# Patient Record
Sex: Female | Born: 1970 | Race: Black or African American | Hispanic: No | Smoking: Former smoker
Health system: Southern US, Community
[De-identification: ages and names within clinical notes are randomized; demographics above are authoritative.]

## PROBLEM LIST (undated history)

## (undated) DIAGNOSIS — F603 Borderline personality disorder: Secondary | ICD-10-CM

## (undated) DIAGNOSIS — I2699 Other pulmonary embolism without acute cor pulmonale: Secondary | ICD-10-CM

## (undated) DIAGNOSIS — F319 Bipolar disorder, unspecified: Secondary | ICD-10-CM

## (undated) DIAGNOSIS — J45909 Unspecified asthma, uncomplicated: Secondary | ICD-10-CM

## (undated) DIAGNOSIS — I82409 Acute embolism and thrombosis of unspecified deep veins of unspecified lower extremity: Secondary | ICD-10-CM

## (undated) DIAGNOSIS — E119 Type 2 diabetes mellitus without complications: Secondary | ICD-10-CM

## (undated) DIAGNOSIS — E7800 Pure hypercholesterolemia, unspecified: Secondary | ICD-10-CM

## (undated) DIAGNOSIS — D649 Anemia, unspecified: Secondary | ICD-10-CM

## (undated) DIAGNOSIS — F4310 Post-traumatic stress disorder, unspecified: Secondary | ICD-10-CM

## (undated) DIAGNOSIS — D689 Coagulation defect, unspecified: Secondary | ICD-10-CM

## (undated) DIAGNOSIS — I10 Essential (primary) hypertension: Secondary | ICD-10-CM

## (undated) DIAGNOSIS — M1990 Unspecified osteoarthritis, unspecified site: Secondary | ICD-10-CM

## (undated) DIAGNOSIS — E669 Obesity, unspecified: Secondary | ICD-10-CM

---

## 2015-11-01 ENCOUNTER — Emergency Department (HOSPITAL_COMMUNITY)
Admission: EM | Admit: 2015-11-01 | Discharge: 2015-11-01 | Disposition: A | Discharge status: Home / Self Care | Payer: Medicaid Other | Attending: Emergency Medicine | Admitting: Emergency Medicine

## 2015-11-01 ENCOUNTER — Encounter (HOSPITAL_COMMUNITY): Payer: Self-pay | Admitting: *Deleted

## 2015-11-01 DIAGNOSIS — Z87891 Personal history of nicotine dependence: Secondary | ICD-10-CM | POA: Diagnosis not present

## 2015-11-01 DIAGNOSIS — E119 Type 2 diabetes mellitus without complications: Secondary | ICD-10-CM | POA: Diagnosis not present

## 2015-11-01 DIAGNOSIS — E669 Obesity, unspecified: Secondary | ICD-10-CM | POA: Insufficient documentation

## 2015-11-01 DIAGNOSIS — L0231 Cutaneous abscess of buttock: Secondary | ICD-10-CM | POA: Insufficient documentation

## 2015-11-01 DIAGNOSIS — R51 Headache: Secondary | ICD-10-CM | POA: Insufficient documentation

## 2015-11-01 DIAGNOSIS — I10 Essential (primary) hypertension: Secondary | ICD-10-CM | POA: Insufficient documentation

## 2015-11-01 HISTORY — DX: Pure hypercholesterolemia, unspecified: E78.00

## 2015-11-01 HISTORY — DX: Obesity, unspecified: E66.9

## 2015-11-01 HISTORY — DX: Type 2 diabetes mellitus without complications: E11.9

## 2015-11-01 HISTORY — DX: Other pulmonary embolism without acute cor pulmonale: I26.99

## 2015-11-01 HISTORY — DX: Acute embolism and thrombosis of unspecified deep veins of unspecified lower extremity: I82.409

## 2015-11-01 HISTORY — DX: Coagulation defect, unspecified: D68.9

## 2015-11-01 HISTORY — DX: Essential (primary) hypertension: I10

## 2015-11-01 HISTORY — DX: Borderline personality disorder: F60.3

## 2015-11-01 HISTORY — DX: Anemia, unspecified: D64.9

## 2015-11-01 HISTORY — DX: Post-traumatic stress disorder, unspecified: F43.10

## 2015-11-01 HISTORY — DX: Bipolar disorder, unspecified: F31.9

## 2015-11-01 NOTE — ED Notes (Signed)
Pt left from waiting room. Will not be back. Nurse was notified.

## 2015-11-01 NOTE — ED Notes (Signed)
Pt reports small abscess or bump on left buttock for extended amount of time but since last night it has increased in size and severe pain. Has headache today but denies n/v or fever.

## 2015-11-04 ENCOUNTER — Emergency Department (HOSPITAL_COMMUNITY)
Admission: EM | Admit: 2015-11-04 | Discharge: 2015-11-04 | Disposition: A | Discharge status: Home / Self Care | Payer: Medicaid Other | Attending: Emergency Medicine | Admitting: Emergency Medicine

## 2015-11-04 ENCOUNTER — Encounter (HOSPITAL_COMMUNITY): Payer: Self-pay

## 2015-11-04 DIAGNOSIS — F319 Bipolar disorder, unspecified: Secondary | ICD-10-CM | POA: Diagnosis not present

## 2015-11-04 DIAGNOSIS — L0231 Cutaneous abscess of buttock: Secondary | ICD-10-CM | POA: Diagnosis present

## 2015-11-04 DIAGNOSIS — E669 Obesity, unspecified: Secondary | ICD-10-CM | POA: Diagnosis not present

## 2015-11-04 DIAGNOSIS — Z86718 Personal history of other venous thrombosis and embolism: Secondary | ICD-10-CM | POA: Diagnosis not present

## 2015-11-04 DIAGNOSIS — L0291 Cutaneous abscess, unspecified: Secondary | ICD-10-CM

## 2015-11-04 DIAGNOSIS — Z87891 Personal history of nicotine dependence: Secondary | ICD-10-CM | POA: Insufficient documentation

## 2015-11-04 DIAGNOSIS — E119 Type 2 diabetes mellitus without complications: Secondary | ICD-10-CM | POA: Diagnosis not present

## 2015-11-04 DIAGNOSIS — Z79899 Other long term (current) drug therapy: Secondary | ICD-10-CM | POA: Insufficient documentation

## 2015-11-04 DIAGNOSIS — I10 Essential (primary) hypertension: Secondary | ICD-10-CM | POA: Diagnosis not present

## 2015-11-04 DIAGNOSIS — Z7901 Long term (current) use of anticoagulants: Secondary | ICD-10-CM | POA: Insufficient documentation

## 2015-11-04 DIAGNOSIS — D649 Anemia, unspecified: Secondary | ICD-10-CM | POA: Insufficient documentation

## 2015-11-04 DIAGNOSIS — Z7984 Long term (current) use of oral hypoglycemic drugs: Secondary | ICD-10-CM | POA: Insufficient documentation

## 2015-11-04 DIAGNOSIS — Z86711 Personal history of pulmonary embolism: Secondary | ICD-10-CM | POA: Insufficient documentation

## 2015-11-04 LAB — CBG MONITORING, ED: Glucose-Capillary: 111 mg/dL — ABNORMAL HIGH (ref 65–99)

## 2015-11-04 MED ORDER — CEPHALEXIN 500 MG PO CAPS: 500.0000 mg | ORAL_CAPSULE | Freq: Four times a day (QID) | ORAL | Status: DC

## 2015-11-04 NOTE — ED Notes (Signed)
Pt. Coming from  Home c/o abscess on left buttocks/upper thigh area. Pt. sts an area has been there over a month, but recently 'blew up' and starting leaking fluid. Pt. sts area is very painful.

## 2015-11-04 NOTE — Discharge Instructions (Signed)
Abscess An abscess is an infected area that contains a collection of pus and debris.It can occur in almost any part of the body. An abscess is also known as a furuncle or boil. CAUSES  An abscess occurs when tissue gets infected. This can occur from blockage of oil or sweat glands, infection of hair follicles, or a minor injury to the skin. As the body tries to fight the infection, pus collects in the area and creates pressure under the skin. This pressure causes pain. People with weakened immune systems have difficulty fighting infections and get certain abscesses more often.  SYMPTOMS Usually an abscess develops on the skin and becomes a painful mass that is red, warm, and tender. If the abscess forms under the skin, you may feel a moveable soft area under the skin. Some abscesses break open (rupture) on their own, but most will continue to get worse without care. The infection can spread deeper into the body and eventually into the bloodstream, causing you to feel ill.  DIAGNOSIS  Your caregiver will take your medical history and perform a physical exam. A sample of fluid may also be taken from the abscess to determine what is causing your infection. TREATMENT  Your caregiver may prescribe antibiotic medicines to fight the infection. However, taking antibiotics alone usually does not cure an abscess. Your caregiver may need to make a small cut (incision) in the abscess to drain the pus. In some cases, gauze is packed into the abscess to reduce pain and to continue draining the area. HOME CARE INSTRUCTIONS   Only take over-the-counter or prescription medicines for pain, discomfort, or fever as directed by your caregiver.  If you were prescribed antibiotics, take them as directed. Finish them even if you start to feel better.  If gauze is used, follow your caregiver's directions for changing the gauze.  To avoid spreading the infection:  Keep your draining abscess covered with a  bandage.  Wash your hands well.  Do not share personal care items, towels, or whirlpools with others.  Avoid skin contact with others.  Keep your skin and clothes clean around the abscess.  Keep all follow-up appointments as directed by your caregiver. SEEK MEDICAL CARE IF:   You have increased pain, swelling, redness, fluid drainage, or bleeding.  You have muscle aches, chills, or a general ill feeling.  You have a fever. MAKE SURE YOU:   Understand these instructions.  Will watch your condition.  Will get help right away if you are not doing well or get worse.   This information is not intended to replace advice given to you by your health care provider. Make sure you discuss any questions you have with your health care provider.   Take your antibiotics as prescribed. Follow-up with your primary care doctor in 3-4 days for wound recheck as well as for a medication adjustment. Please take all of your other medications as prescribed including your blood thinners and diabetic medications. Return to the ED if you experience increased swelling in the area, fevers, chills or shortness of breath.

## 2015-11-04 NOTE — ED Notes (Signed)
RN discharged patient. Pt. Screaming down hall because no one would check her INR. Pt. Approached by security and asked to leave since she had been discharged. Unable to get patient signature.

## 2015-11-04 NOTE — ED Provider Notes (Signed)
CSN: [id number]     Arrival date & time 11/04/15  1028 History   First MD Initiated Contact with Patient 11/04/15 1104     Chief Complaint  Patient presents with  . Abscess     (Consider location/radiation/quality/duration/timing/severity/associated sxs/prior Treatment) HPI  Sreya Froio is a 45 year old female with past medical history of DM, HLD, HTN, DVT, PE, bipolar disorder who presents the ED complaining of an abscess on her left buttocks. Patient states that a week ago she noticed a small area on her left buttocks felt like a boil. Patient states this area began to get larger and larger. On Sunday, 4 days ago she states the area was very big and swollen. Later that night she states that it busted and a significant amount of pus came out. Since then patient states she just had pain in that area and there is an open sore. She denies fevers, chills.  Of note, patient states that she has not checked her blood sugar in several days and she is also not taken her diabetic medications, blood thinners or other medications.  Past Medical History  Diagnosis Date  . Diabetes mellitus without complication (HCC)   . Obesity   . High cholesterol   . Anemia   . Hypertension   . DVT (deep venous thrombosis) (HCC)   . Blood clotting disorder (HCC)   . PE (pulmonary embolism)   . Bipolar 1 disorder (HCC)   . PTSD (post-traumatic stress disorder)   . Borderline personality disorder    History reviewed. No pertinent past surgical history. History reviewed. No pertinent family history. Social History  Substance Use Topics  . Smoking status: Former Games developer  . Smokeless tobacco: None  . Alcohol Use: Yes   OB History    No data available     Review of Systems  All other systems reviewed and are negative.     Allergies  Review of patient's allergies indicates no known allergies.  Home Medications   Prior to Admission medications   Medication Sig Start Date End Date Taking?  Authorizing Provider  buPROPion (WELLBUTRIN SR) 100 MG 12 hr tablet Take 100 mg by mouth 2 (two) times daily.   Yes Historical Provider, MD  ferrous sulfate 325 (65 FE) MG tablet Take 325 mg by mouth 2 (two) times daily with a meal.   Yes Historical Provider, MD  lisinopril (PRINIVIL,ZESTRIL) 10 MG tablet Take 10 mg by mouth daily.   Yes Historical Provider, MD  metFORMIN (GLUCOPHAGE-XR) 500 MG 24 hr tablet Take 500 mg by mouth daily with breakfast.   Yes Historical Provider, MD  omeprazole (PRILOSEC) 20 MG capsule Take 20 mg by mouth daily.   Yes Historical Provider, MD  potassium chloride (K-DUR) 10 MEQ tablet Take 10 mEq by mouth daily.   Yes Historical Provider, MD  triamterene-hydrochlorothiazide (MAXZIDE-25) 37.5-25 MG tablet Take 1 tablet by mouth daily.   Yes Historical Provider, MD  warfarin (COUMADIN) 1 MG tablet Take 4-5 mg by mouth See admin instructions. Pt takes 5 mg on all days except on Tuesday and Thursday.   Yes Historical Provider, MD  warfarin (COUMADIN) 4 MG tablet Take 4-5 mg by mouth See admin instructions. Patient was taking 4 mg Tuesday and Thursday all other days was 5 mg   Yes Historical Provider, MD   BP 139/84 mmHg  Pulse 62  Temp(Src) 98.9 F (37.2 C) (Oral)  Ht  (1.778 m)  Wt 118.389 kg  BMI 37.45 kg/m2  SpO2 98%  LMP 10/25/2015 Physical Exam  Constitutional: She is oriented to person, place, and time. She appears well-developed and well-nourished. No distress.  HENT:  Head: Normocephalic and atraumatic.  Eyes: Conjunctivae are normal. Right eye exhibits no discharge. Left eye exhibits no discharge. No scleral icterus.  Cardiovascular: Normal rate.   Pulmonary/Chest: Effort normal.  Neurological: She is alert and oriented to person, place, and time. Coordination normal.  Skin: Skin is warm and dry. No rash noted. She is not diaphoretic. No erythema. No pallor.  1cm open wound, previously drained abscess on left buttock. Mild surrounding erythema. No  edema or induration. No fluctuance. No streaking.   Psychiatric: She has a normal mood and affect. Her behavior is normal.  Nursing note and vitals reviewed.   ED Course  Procedures (including critical care time) Labs Review Labs Reviewed  CBG MONITORING, ED    Imaging Review No results found. I have personally reviewed and evaluated these images and lab results as part of my medical decision-making.   EKG Interpretation None      MDM   Final diagnoses:  Abscess   45 y.o F with hx of DM, PE/DVT, bipolar d/o presents to the ED c/o an abscess on her left buttock. Pt states the area was very swollen over the weekend but is busted 3 days ago and a significant amount out purulent drainage came out. Swelling is now down but the area is painful. Pt appears well in ED, non-toxic, non-septic appearing. All VSS. There is a small open wound on her left buttock. Pt likely had an abscess that had already drained. No induration or fluctuance. No indication for I&D as the area has already drained. Pt does warrant abx given DM hx. Pt also states that she is noncompliant with her home meds including her DM medications. CBG today is 111. PT is otherwise asymptomatic. Do not feel further workup is indicated at this time. Pt does not meet SIRS or sepsis criteria. Discussed in detail with pt the importance of medication compliance and pt expresses complete understanding. Encourage follow up with PCP for medication management as well as wound recheck. Pt is hemodynamically stable and ready for discharge.      Lester KinsmanSamantha Tripp [place], PA-C 11/05/15 1212  Alvira MondayErin Schlossman, MD 11/08/15 0430

## 2016-10-10 ENCOUNTER — Encounter (HOSPITAL_COMMUNITY): Payer: Self-pay | Admitting: Emergency Medicine

## 2016-10-10 ENCOUNTER — Emergency Department (HOSPITAL_COMMUNITY)
Admission: EM | Admit: 2016-10-10 | Discharge: 2016-10-10 | Disposition: A | Discharge status: Home / Self Care | Payer: Medicaid Other | Attending: Emergency Medicine | Admitting: Emergency Medicine

## 2016-10-10 ENCOUNTER — Emergency Department (HOSPITAL_COMMUNITY): Payer: Medicaid Other

## 2016-10-10 DIAGNOSIS — E119 Type 2 diabetes mellitus without complications: Secondary | ICD-10-CM | POA: Diagnosis not present

## 2016-10-10 DIAGNOSIS — Z79899 Other long term (current) drug therapy: Secondary | ICD-10-CM | POA: Diagnosis not present

## 2016-10-10 DIAGNOSIS — Z87891 Personal history of nicotine dependence: Secondary | ICD-10-CM | POA: Diagnosis not present

## 2016-10-10 DIAGNOSIS — Z7901 Long term (current) use of anticoagulants: Secondary | ICD-10-CM | POA: Diagnosis not present

## 2016-10-10 DIAGNOSIS — Z7984 Long term (current) use of oral hypoglycemic drugs: Secondary | ICD-10-CM | POA: Insufficient documentation

## 2016-10-10 DIAGNOSIS — J45909 Unspecified asthma, uncomplicated: Secondary | ICD-10-CM | POA: Diagnosis not present

## 2016-10-10 DIAGNOSIS — J189 Pneumonia, unspecified organism: Secondary | ICD-10-CM | POA: Insufficient documentation

## 2016-10-10 DIAGNOSIS — R0602 Shortness of breath: Secondary | ICD-10-CM | POA: Diagnosis present

## 2016-10-10 DIAGNOSIS — I10 Essential (primary) hypertension: Secondary | ICD-10-CM | POA: Diagnosis not present

## 2016-10-10 HISTORY — DX: Unspecified osteoarthritis, unspecified site: M19.90

## 2016-10-10 HISTORY — DX: Unspecified asthma, uncomplicated: J45.909

## 2016-10-10 LAB — I-STAT TROPONIN, ED: Troponin i, poc: 0 ng/mL (ref 0.00–0.08)

## 2016-10-10 LAB — BASIC METABOLIC PANEL
ANION GAP: 6 (ref 5–15)
BUN: 9 mg/dL (ref 6–20)
CHLORIDE: 104 mmol/L (ref 101–111)
CO2: 27 mmol/L (ref 22–32)
Calcium: 8.9 mg/dL (ref 8.9–10.3)
Creatinine, Ser: 0.89 mg/dL (ref 0.44–1.00)
GFR calc non Af Amer: >60 mL/min (ref >60)
Glucose, Bld: 124 mg/dL — ABNORMAL HIGH (ref 65–99)
POTASSIUM: 3.7 mmol/L (ref 3.5–5.1)
SODIUM: 137 mmol/L (ref 135–145)

## 2016-10-10 LAB — CBC
HEMATOCRIT: 37.1 % (ref 36.0–46.0)
HEMOGLOBIN: 12.9 g/dL (ref 12.0–15.0)
MCH: 30.6 pg (ref 26.0–34.0)
MCHC: 34.8 g/dL (ref 30.0–36.0)
MCV: 87.9 fL (ref 78.0–100.0)
Platelets: 289 10*3/uL (ref 150–400)
RBC: 4.22 MIL/uL (ref 3.87–5.11)
RDW: 16.1 % — ABNORMAL HIGH (ref 11.5–15.5)
WBC: 4.8 10*3/uL (ref 4.0–10.5)

## 2016-10-10 LAB — TROPONIN I: Troponin I: <0.03 ng/mL (ref <0.03)

## 2016-10-10 LAB — BRAIN NATRIURETIC PEPTIDE: B NATRIURETIC PEPTIDE 5: 22.4 pg/mL (ref 0.0–100.0)

## 2016-10-10 LAB — POC URINE PREG, ED: Preg Test, Ur: NEGATIVE

## 2016-10-10 LAB — PROTIME-INR
INR: 1.04
PROTHROMBIN TIME: 13.6 s (ref 11.4–15.2)

## 2016-10-10 MED ORDER — IOPAMIDOL (ISOVUE-370) INJECTION 76%
INTRAVENOUS | Status: AC
  Administered 2016-10-10: 100 mL via INTRAVENOUS
  Filled 2016-10-10: qty 100

## 2016-10-10 MED ORDER — LEVOFLOXACIN 750 MG PO TABS: 750.0000 mg | ORAL_TABLET | Freq: Every day | ORAL | 0 refills | Status: AC

## 2016-10-10 NOTE — Discharge Instructions (Signed)
We found a pneumonia today. Please take your antibiotics for the next 5 days.  Please schedule an appointment to follow-up with a PCP for further management of your symptoms. If symptoms change or worsen, please return to the nearest emergency department cleared

## 2016-10-10 NOTE — ED Notes (Signed)
Bed: [facility] Expected date:  Expected time:  Means of arrival:  Comments: 

## 2016-10-10 NOTE — ED Triage Notes (Addendum)
Per EMS. Pt staying at friend's home. Pt called EMS for SOB since last night. Pt hyperventilating on arrival, which decreased after EMS talked to pt to calm her. Lungs sounds equal and clear. EMS EKG unremarkable. Pt initially also complained of "lung pain" with deep breath, which subsided prior to arrival in the ED.  Pt also reports she is out of her home medications latuda and wellbutrin because she has been unable to get in to see her PCP. Also has not taken her am medications today.  Also complains of chronic lower back pain worse with movement. Pt also complains of lower CP with deep breath. Hx of PE.

## 2016-10-10 NOTE — ED Provider Notes (Signed)
[facility]-EMERGENCY DEPT Provider Note   CSN: [id number] Arrival date & time: 10/10/16  1009     History   Chief Complaint Chief Complaint  Patient presents with  . Shortness of Breath  . Medication Refill    HPI Allison Hendricks is a 46 y.o. female.  The history is provided by the patient.  Shortness of Breath  This is a new problem. The average episode lasts 12 hours. The problem occurs continuously.The current episode started 12 to 24 hours ago. The problem has been gradually improving. Associated symptoms include chest pain and leg swelling (chronic). Pertinent negatives include no fever, no headaches, no rhinorrhea, no neck pain, no cough, no sputum production, no hemoptysis, no wheezing, no orthopnea, no vomiting, no abdominal pain, no rash and no leg pain. She has tried nothing for the symptoms. The treatment provided no relief. Associated medical issues include asthma, PE and DVT (hx of).  Medication Refill    Past Medical History:  Diagnosis Date  . Anemia   . Arthritis   . Asthma   . Bipolar 1 disorder (HCC)   . Blood clotting disorder (HCC)   . Borderline personality disorder   . Diabetes mellitus without complication (HCC)   . DVT (deep venous thrombosis) (HCC)   . High cholesterol   . Hypertension   . Obesity   . PE (pulmonary embolism)   . PTSD (post-traumatic stress disorder)     There are no active problems to display for this patient.   History reviewed. No pertinent surgical history.  OB History    No data available       Home Medications    Prior to Admission medications   Medication Sig Start Date End Date Taking? Authorizing Provider  buPROPion (WELLBUTRIN SR) 100 MG 12 hr tablet Take 100 mg by mouth 2 (two) times daily.    Historical Provider, MD  cephALEXin (KEFLEX) 500 MG capsule Take 1 capsule (500 mg total) by mouth 4 (four) times daily. 11/04/15   Samantha Tripp [place], PA-C  ferrous sulfate 325 (65 FE) MG tablet Take 325 mg by mouth 2 (two)  times daily with a meal.    Historical Provider, MD  lisinopril (PRINIVIL,ZESTRIL) 10 MG tablet Take 10 mg by mouth daily.    Historical Provider, MD  metFORMIN (GLUCOPHAGE-XR) 500 MG 24 hr tablet Take 500 mg by mouth daily with breakfast.    Historical Provider, MD  omeprazole (PRILOSEC) 20 MG capsule Take 20 mg by mouth daily.    Historical Provider, MD  potassium chloride (K-DUR) 10 MEQ tablet Take 10 mEq by mouth daily.    Historical Provider, MD  triamterene-hydrochlorothiazide (MAXZIDE-25) 37.5-25 MG tablet Take 1 tablet by mouth daily.    Historical Provider, MD  warfarin (COUMADIN) 1 MG tablet Take 4-5 mg by mouth See admin instructions. Pt takes 5 mg on all days except on Tuesday and Thursday.    Historical Provider, MD  warfarin (COUMADIN) 4 MG tablet Take 4-5 mg by mouth See admin instructions. Patient was taking 4 mg Tuesday and Thursday all other days was 5 mg    Historical Provider, MD    Family History History reviewed. No pertinent family history.  Social History Social History  Substance Use Topics  . Smoking status: Former Games developer  . Smokeless tobacco: Not on file  . Alcohol use Yes     Allergies   Patient has no known allergies.   Review of Systems Review of Systems  Constitutional: Negative for chills, diaphoresis,  fatigue and fever.  HENT: Negative for congestion and rhinorrhea.   Respiratory: Positive for chest tightness and shortness of breath. Negative for cough, hemoptysis, sputum production, wheezing and stridor.   Cardiovascular: Positive for chest pain and leg swelling (chronic). Negative for palpitations and orthopnea.  Gastrointestinal: Negative for abdominal pain, diarrhea, nausea and vomiting.  Genitourinary: Negative for dysuria.  Musculoskeletal: Positive for back pain. Negative for neck pain and neck stiffness.  Skin: Negative for rash and wound.  Neurological: Negative for light-headedness and headaches.  Psychiatric/Behavioral: Negative for  agitation and confusion.  All other systems reviewed and are negative.    Physical Exam Updated Vital Signs BP 118/80   Pulse (!) 58   Temp 98.3 F (36.8 C)   Resp 16   Wt 292 lb (132.5 kg)   LMP 10/03/2016   SpO2 97%   BMI 41.90 kg/m   Physical Exam  Constitutional: She is oriented to person, place, and time. She appears well-developed and well-nourished. No distress.  HENT:  Head: Normocephalic and atraumatic.  Right Ear: External ear normal.  Left Ear: External ear normal.  Nose: Nose normal.  Mouth/Throat: Oropharynx is clear and moist. No oropharyngeal exudate.  Eyes: Conjunctivae and EOM are normal. Pupils are equal, round, and reactive to light.  Neck: Normal range of motion. Neck supple.  Cardiovascular: Normal rate, regular rhythm, normal heart sounds and intact distal pulses.   No murmur heard. Pulmonary/Chest: Effort normal. No stridor. No respiratory distress. She has no wheezes. She has no rales.     She exhibits tenderness.    Abdominal: Soft. She exhibits no distension. There is no tenderness. There is no rebound.  Musculoskeletal: She exhibits tenderness.  Neurological: She is alert and oriented to person, place, and time. She has normal reflexes. No cranial nerve deficit or sensory deficit. She exhibits normal muscle tone.  Skin: Skin is warm. Capillary refill takes less than 2 seconds. No rash noted. She is not diaphoretic. No erythema.  Psychiatric: She has a normal mood and affect.  Nursing note and vitals reviewed.    ED Treatments / Results  Labs (all labs ordered are listed, but only abnormal results are displayed) Labs Reviewed  BASIC METABOLIC PANEL - Abnormal; Notable for the following:       Result Value   Glucose, Bld 124 (*)    All other components within normal limits  CBC - Abnormal; Notable for the following:    RDW 16.1 (*)    All other components within normal limits  PROTIME-INR  BRAIN NATRIURETIC PEPTIDE  TROPONIN I    I-STAT TROPOININ, ED  POC URINE PREG, ED  I-STAT TROPOININ, ED    EKG  EKG Interpretation  Date/Time:  Tuesday October 10 2016 10:34:36 EDT Ventricular Rate:  65 PR Interval:    QRS Duration: 94 QT Interval:  427 QTC Calculation: 444 R Axis:   -24 Text Interpretation:  Sinus rhythm LVH by voltage No prior ECG for comparison.  Inverted T wave in lead 3.  No STEMI Confirmed by Rush Landmark MD, CHRISTOPHER [phone]) on 10/10/2016 10:38:37 AM       Radiology Dg Chest 2 View  Result Date: 10/10/2016 CLINICAL DATA:  Shortness of Breath for 12 hours, cough, wheezing EXAM: CHEST  2 VIEW COMPARISON:  None. FINDINGS: Cardiomediastinal silhouette is unremarkable. No infiltrate or pleural effusion. No pulmonary edema. Bony thorax is unremarkable. IMPRESSION: No active cardiopulmonary disease. Electronically Signed   By: Natasha Mead M.D.   On: 10/10/2016 11:03  Ct Angio Chest Pe W And/or Wo Contrast  Result Date: 10/10/2016 CLINICAL DATA:  Chest pain and shortness of Breath EXAM: CT ANGIOGRAPHY CHEST WITH CONTRAST TECHNIQUE: Multidetector CT imaging of the chest was performed using the standard protocol during bolus administration of intravenous contrast. Multiplanar CT image reconstructions and MIPs were obtained to evaluate the vascular anatomy. CONTRAST:  100 mL Isovue 370. COMPARISON:  Plain film from earlier in the same day. FINDINGS: Cardiovascular: Thoracic aorta is well visualize without evidence of aneurysmal dilation or dissection. The pulmonary artery shows a normal branching pattern without evidence of pulmonary embolism. No cardiac enlargement is seen. No significant coronary enlargement is noted. Mediastinum/Nodes: No hilar or mediastinal adenopathy is noted. The thoracic inlet is within normal limits. No axillary adenopathy is seen. Lungs/Pleura: The lungs are well aerated bilaterally. Focal mild infiltrate is noted in the lateral aspect of the right lower lobe along the diaphragm. No other  focal infiltrate is seen. No sizable parenchymal nodule is noted. Upper Abdomen: Within normal limits. Musculoskeletal: Within normal limits. Review of the MIP images confirms the above findings. IMPRESSION: Mild right lower lobe infiltrate. No evidence of pulmonary emboli. Electronically Signed   By: Alcide Clever M.D.   On: 10/10/2016 13:44    Procedures Procedures (including critical care time)  Medications Ordered in ED Medications  iopamidol (ISOVUE-370) 76 % injection (100 mLs Intravenous Contrast Given 10/10/16 1319)     Initial Impression / Assessment and Plan / ED Course  I have reviewed the triage vital signs and the nursing notes.  Pertinent labs & imaging results that were available during my care of the patient were reviewed by me and considered in my medical decision making (see chart for details).     Allison Hendricks is a 46 y.o. female With a past medical history significant for diabetes, asthma, DVT and pulmonary embolism on Pradaxa, bipolar disorder, and borderline personality disorder who presents with shortness of breath, chest pain, and cough. Patient reports that she has had symptoms since 11 PM yesterday. She says that wall watching TV at home, she had onset of left lateral chest pain radiating around her chest. She reports associated shortness of breath that was very pleuritic in nature. She denies it being exertional. She denies fevers or chills. She does report that she has had a cough for the last few days. She describes her chest discomfort as a "20 out of 10" but is currently a four out of 10.   Patient denies any urine symptoms, constipation, diarrhea, or any changes in lower extremity edema.  History and exam are seen above.  Based on patients complaint, and her reported missing occasional doses of Pradaxa, there is concern for pulmonary embolism as cause of symptoms. Pneumonia also considered given cough.  Patient had workup showing no evidence of PE but imaging  did show evidence of pneumonia. Laboratory testing otherwise reassuring. Delta troponin was negative as initial troponin was added to the original blood and a second one was drawn for comparison. Timestamp was incorrect on lab testing report.  Given the infiltrate, patient will be treated for community associated pneumonia. With patient's comorbidities, levofloxacin was chosen.  EKG shows no STEMI.  Patient given prescription for five days of antibiotics and instructions to follow up with her PCP. Patient encouraged to continue taking her blood thinners. Patient also encouraged to follow up with her PCP in the next several days as was strict return precautions for any new or worsened symptoms.   Patient  had no other questions or concerns and patient was discharged in good condition.   Final Clinical Impressions(s) / ED Diagnoses   Final diagnoses:  Community acquired pneumonia, unspecified laterality    New Prescriptions Discharge Medication List as of 10/10/2016  4:08 PM    START taking these medications   Details  levofloxacin (LEVAQUIN) 750 MG tablet Take 1 tablet (750 mg total) by mouth daily., Starting Tue 10/10/2016, Print        Clinical Impression: 1. Community acquired pneumonia, unspecified laterality     Disposition: Discharge  Condition: Good  I have discussed the results, Dx and Tx plan with the pt(& family if present). He/she/they expressed understanding and agree(s) with the plan. Discharge instructions discussed at great length. Strict return precautions discussed and pt &/or family have verbalized understanding of the instructions. No further questions at time of discharge.    Discharge Medication List as of 10/10/2016  4:08 PM    START taking these medications   Details  levofloxacin (LEVAQUIN) 750 MG tablet Take 1 tablet (750 mg total) by mouth daily., Starting Tue 10/10/2016, Print        Follow Up: [facility] [address] [place] [address] [phone] Schedule an appointment as soon as possible for a visit    [facility] DEPT [address] [id number] [address] [place] [address] [phone]  If symptoms worsen     Heide Scales, MD 10/10/16 2232

## 2018-03-16 IMAGING — CR DG CHEST 2V
2 series · 2 of 2 positions shown · non-contrast
Comparison: None.

CLINICAL DATA: Shortness of Breath for 12 hours, cough, wheezing

EXAM:
CHEST  2 VIEW

[w chest pa]
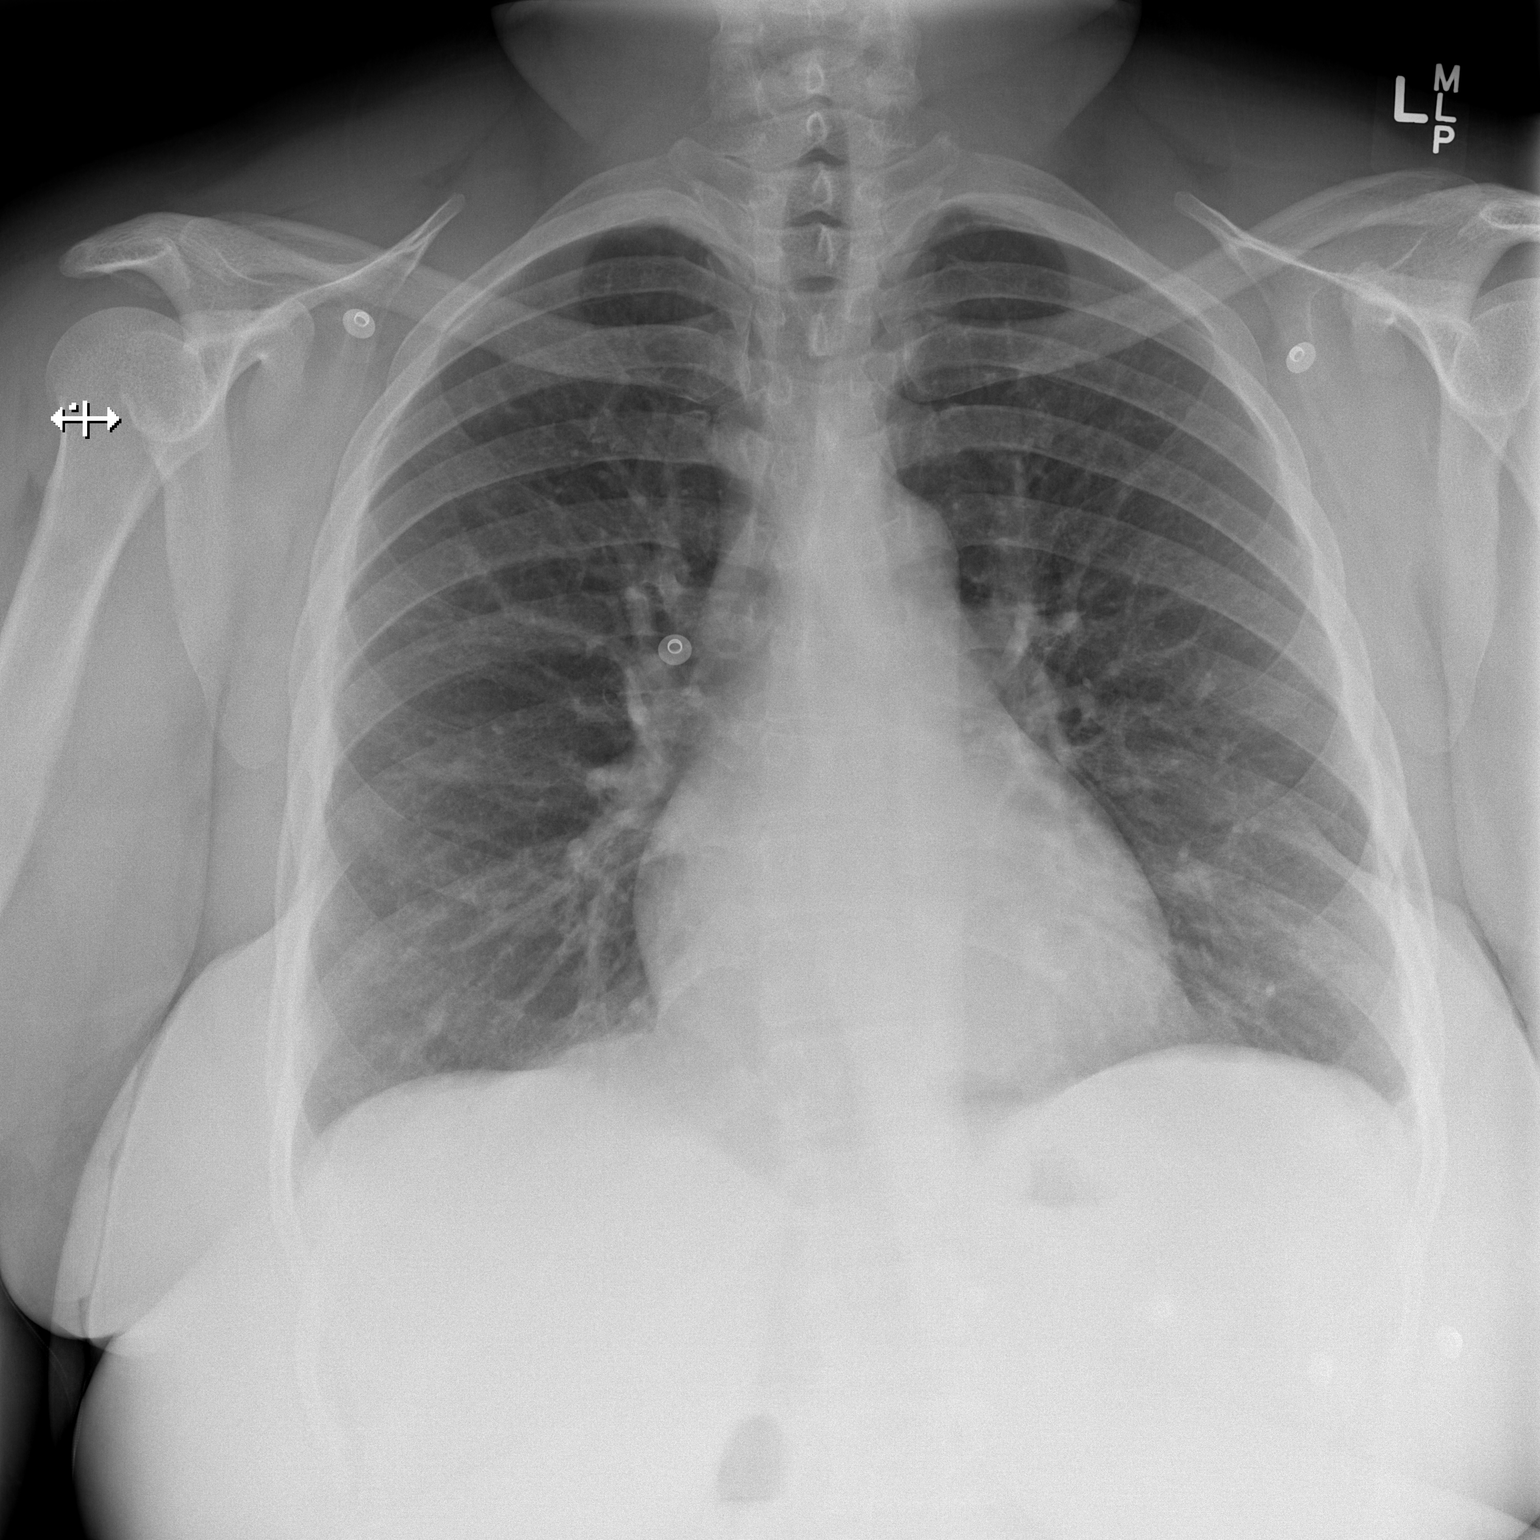

[w chest lat]
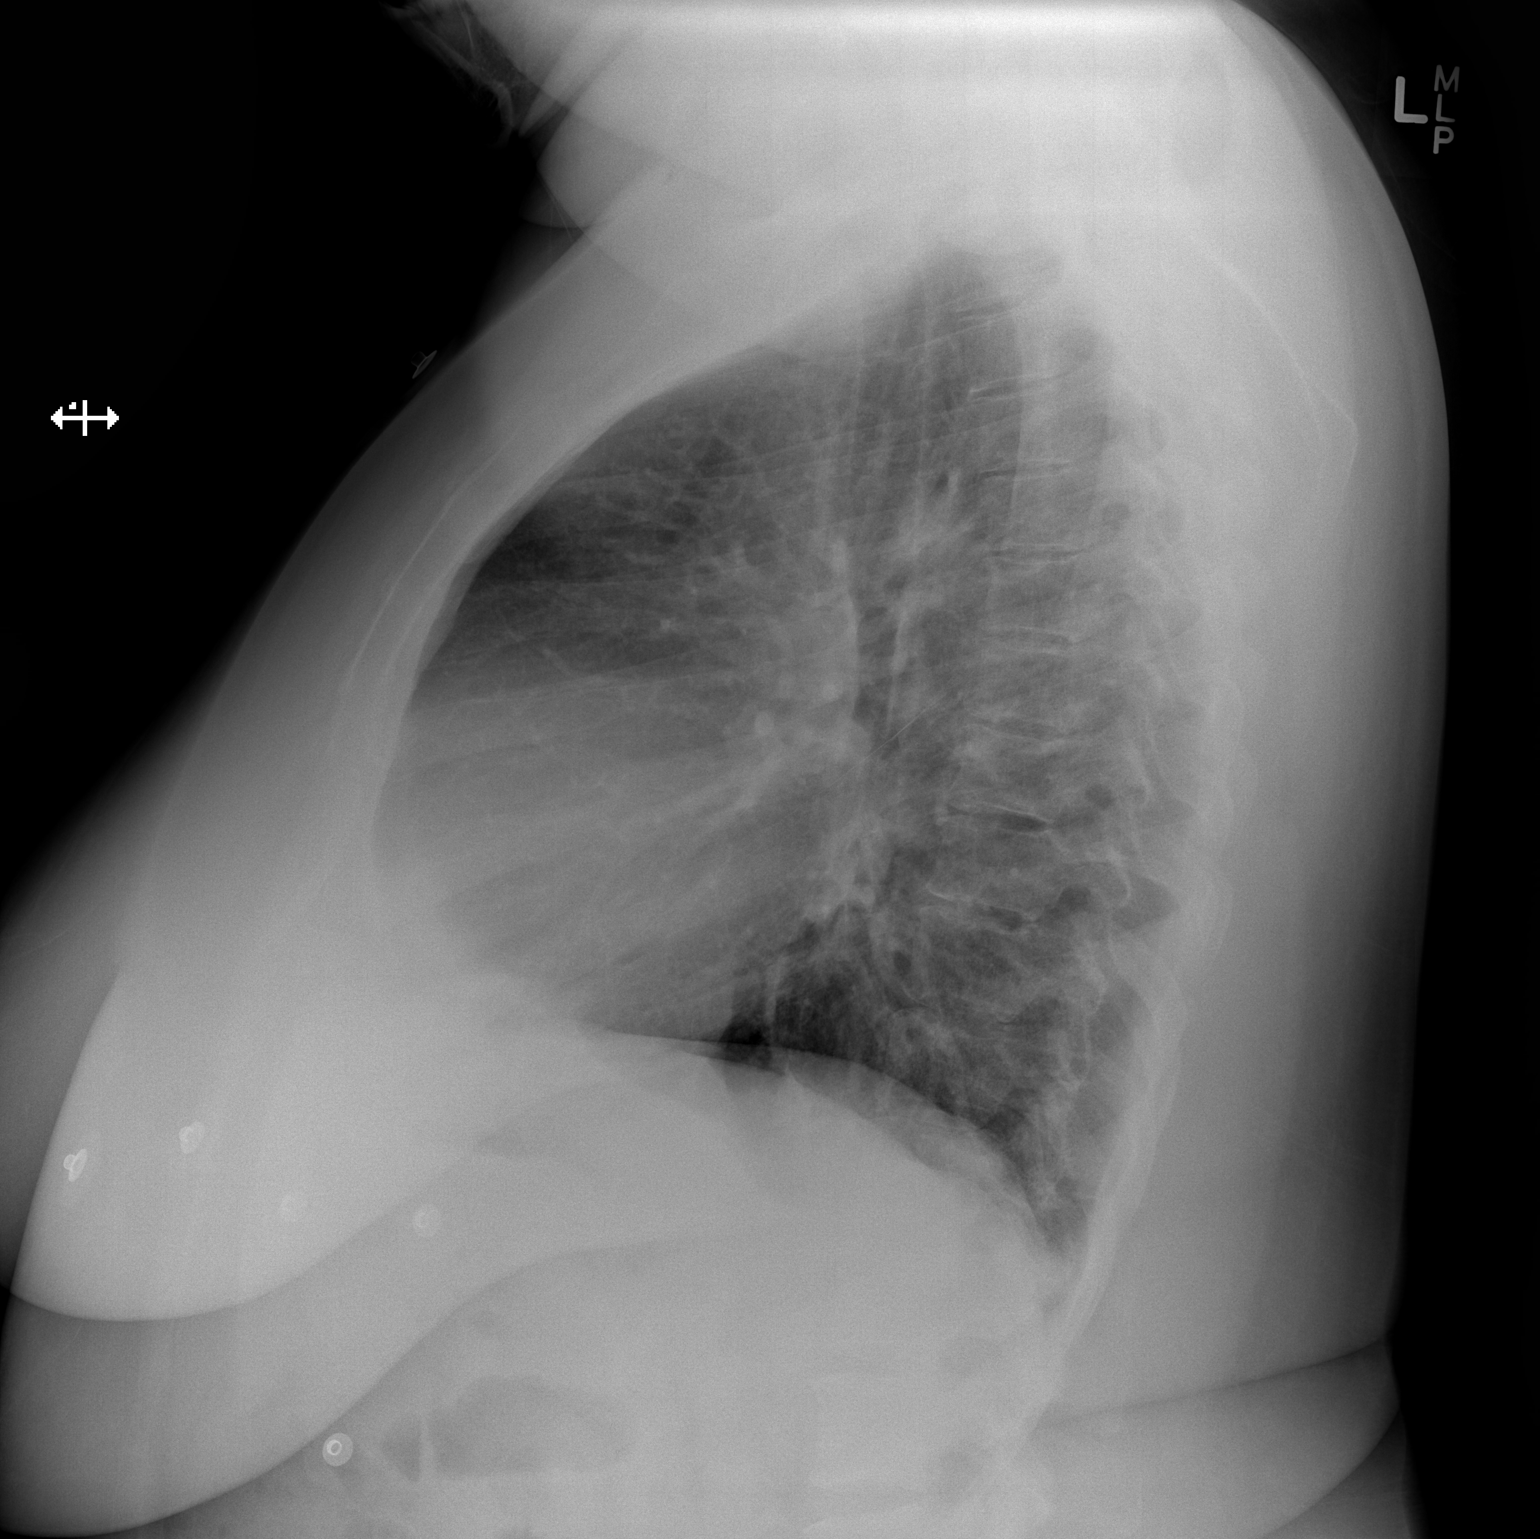

[2 of 2 positions shown; findings below may reference images not displayed]

FINDINGS: Cardiomediastinal silhouette is unremarkable. No infiltrate or
pleural effusion. No pulmonary edema. Bony thorax is unremarkable.
IMPRESSION: No active cardiopulmonary disease.

## 2018-03-16 IMAGING — CT CT ANGIO CHEST
2 of 6 series · 19 of 36 positions shown · IV contrast (ISOVUE 370)
Comparison: Plain film from earlier in the same day.

CLINICAL DATA: Chest pain and shortness of Breath

EXAM:
CT ANGIOGRAPHY CHEST WITH CONTRAST
TECHNIQUE: Multidetector CT imaging of the chest was performed using the
standard protocol during bolus administration of intravenous
contrast. Multiplanar CT image reconstructions and MIPs were
obtained to evaluate the vascular anatomy.
CONTRAST:  100 mL Isovue 370.

[Series 7: thins for pacs · axial · 0.73mm/px · z∈[-252,-12]mm · 18 of 268 slices shown]
[im 14/268  lung]
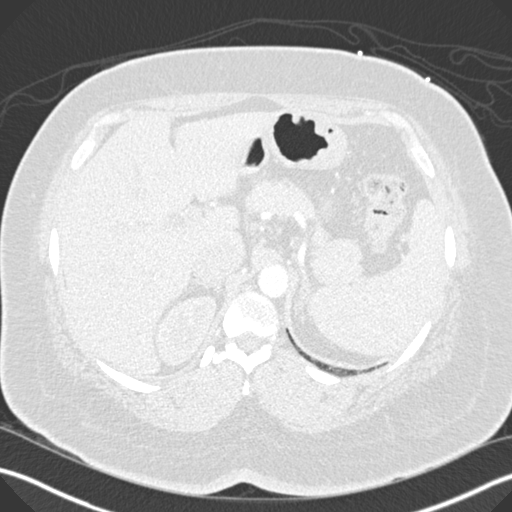
[im 27/268  mediastinal]
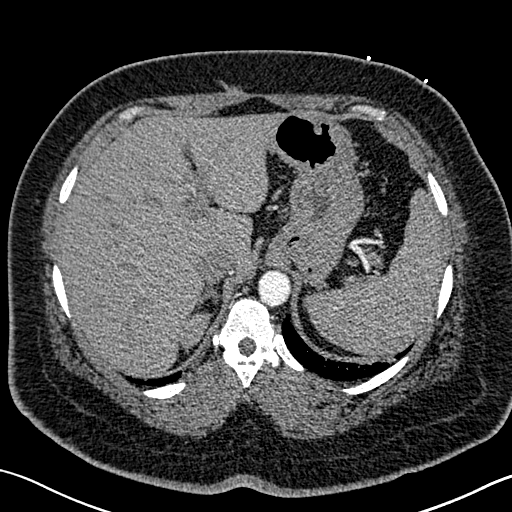
[im 41/268  lung]
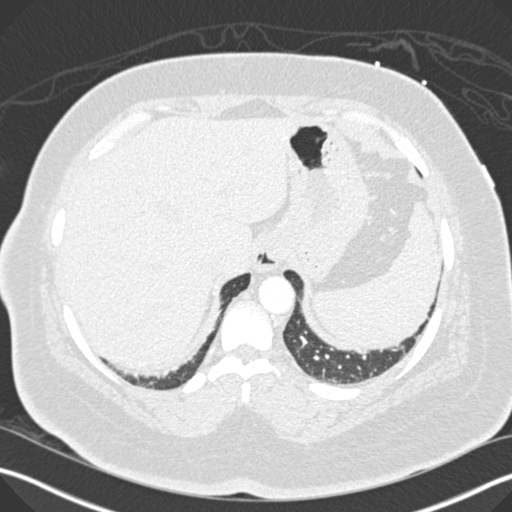
[im 54/268  mediastinal]
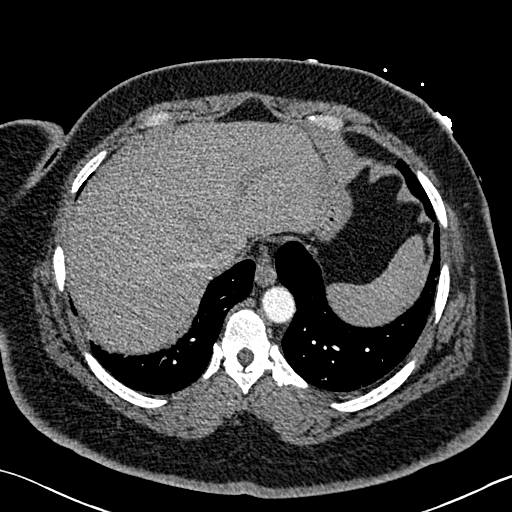
[im 67/268  lung]
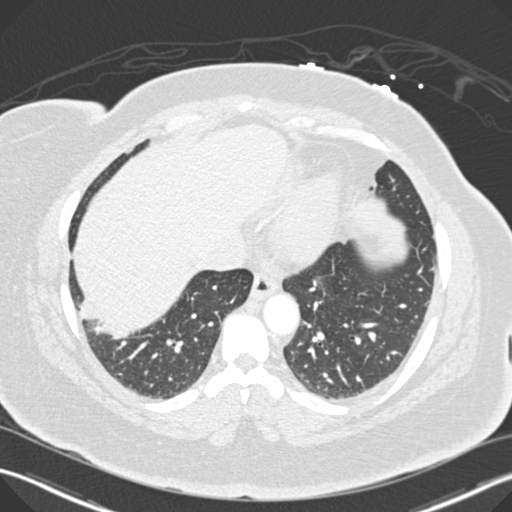
[im 81/268  mediastinal]
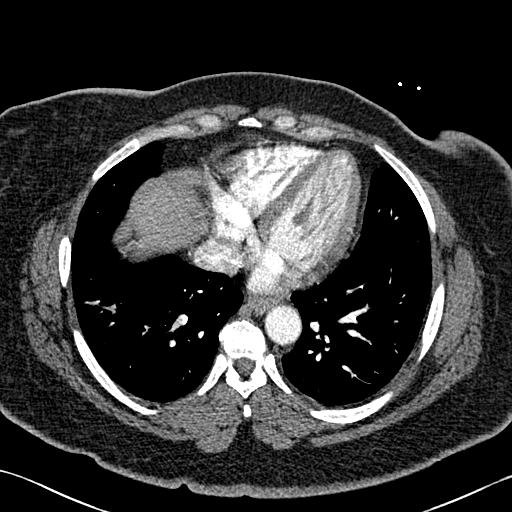
[im 94/268  lung]
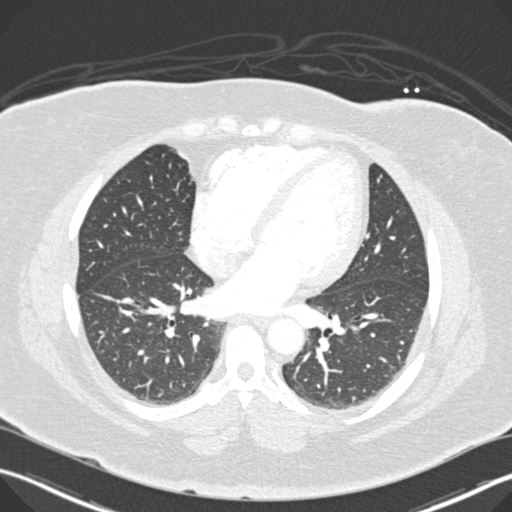
[im 107/268  mediastinal]
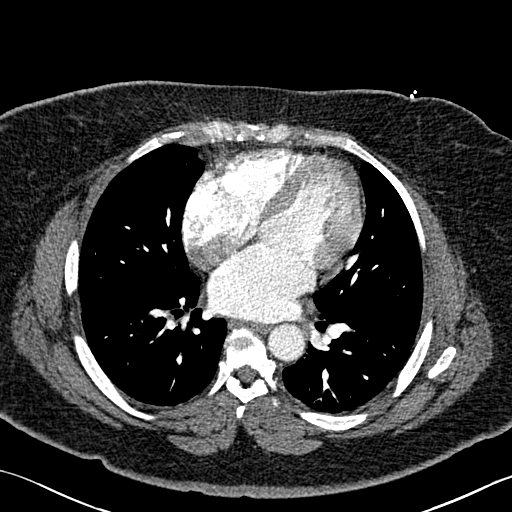
[im 121/268  lung]
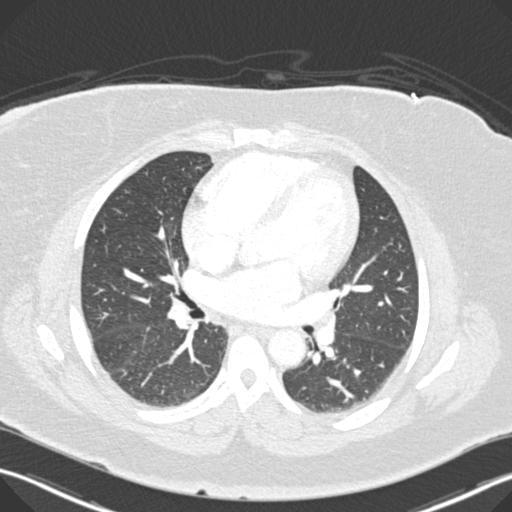
[im 147/268  mediastinal]
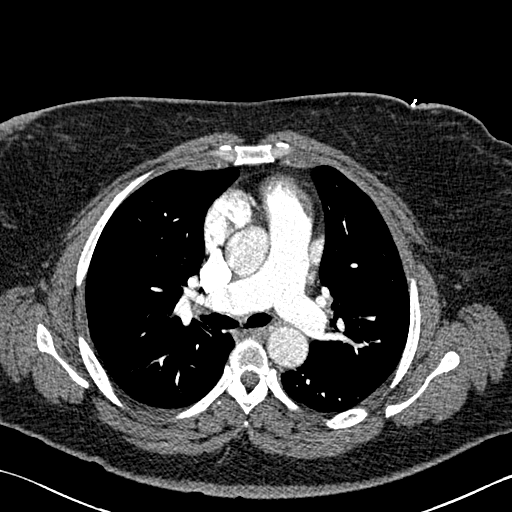
[im 161/268  lung]
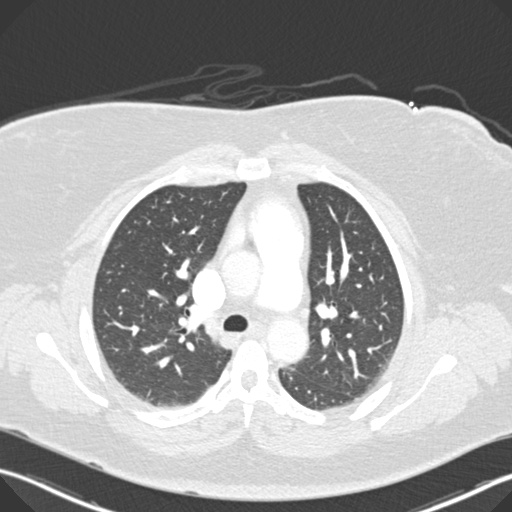
[im 174/268  mediastinal]
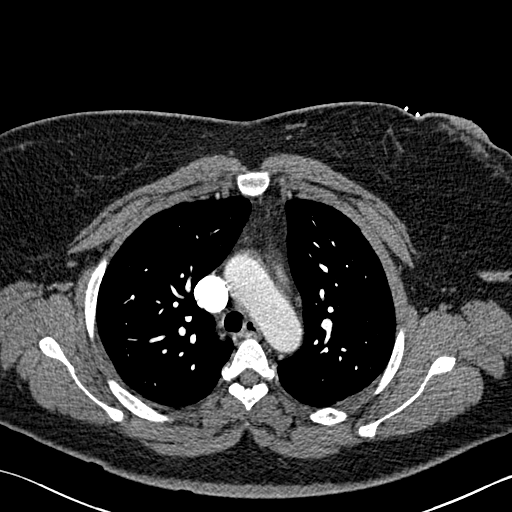
[im 187/268  lung]
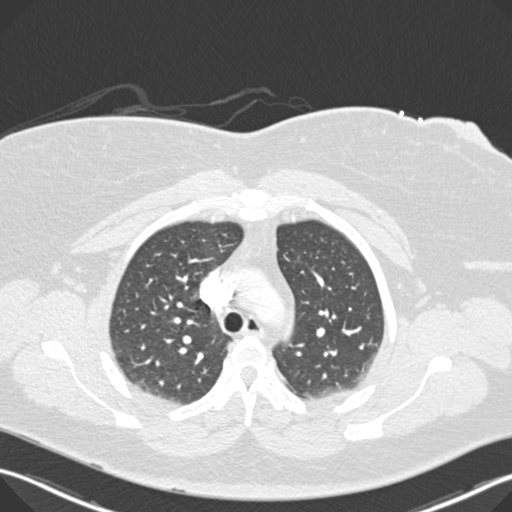
[im 201/268  mediastinal]
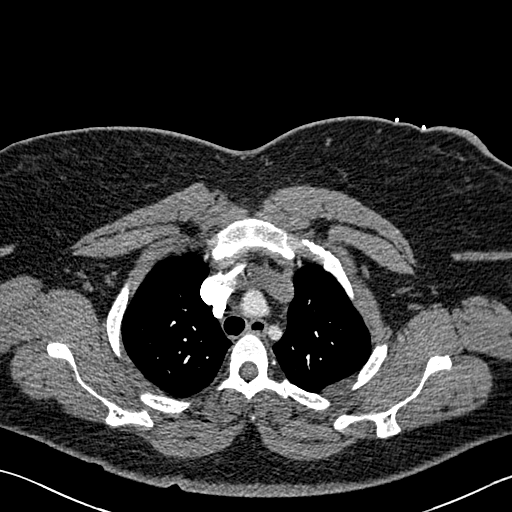
[im 214/268  lung]
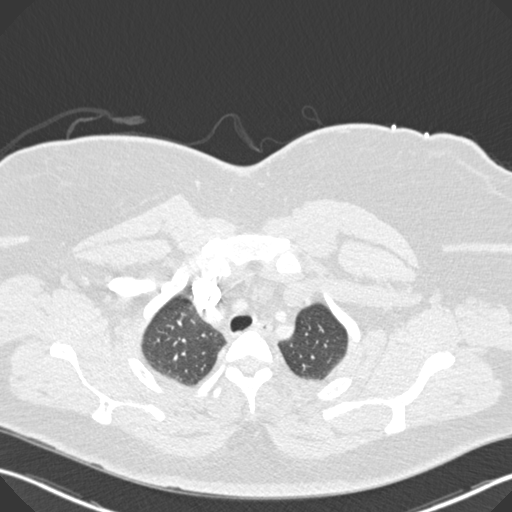
[im 227/268  mediastinal]
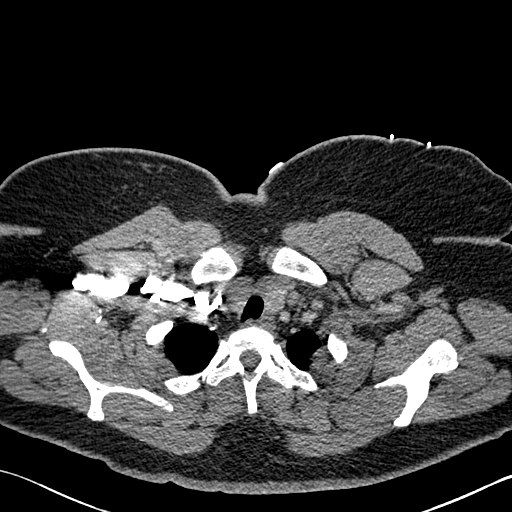
[im 241/268  lung]
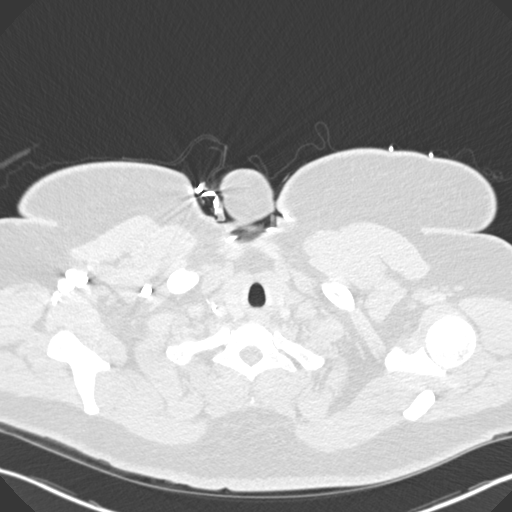
[im 254/268  mediastinal]
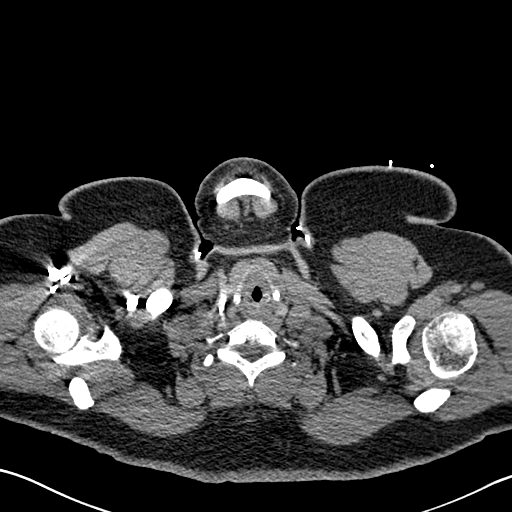

[Series 9: coronal mpr · coronal · 0.51mm/px · 1 of 142 slices shown]
[im 71/142  mediastinal]
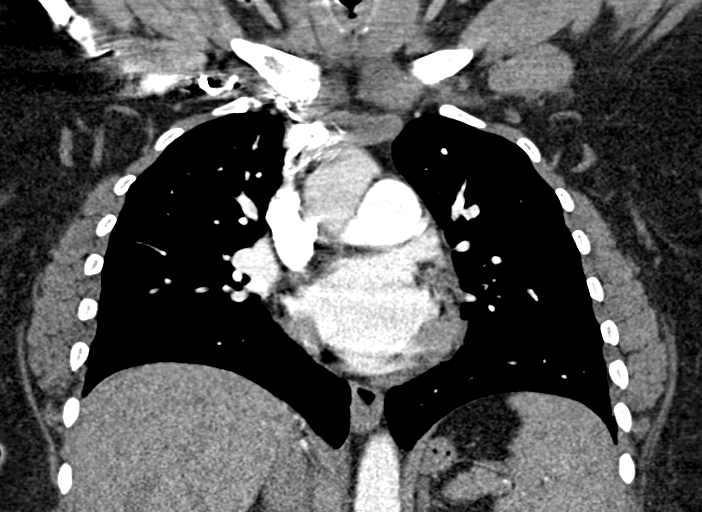

[19 of 36 positions shown; findings below may reference images not displayed]

FINDINGS: Cardiovascular: Thoracic aorta is well visualize without evidence of
aneurysmal dilation or dissection. The pulmonary artery shows a
normal branching pattern without evidence of pulmonary embolism. No
cardiac enlargement is seen. No significant coronary enlargement is
noted.

Mediastinum/Nodes: No hilar or mediastinal adenopathy is noted. The
thoracic inlet is within normal limits. No axillary adenopathy is
seen.

Lungs/Pleura: The lungs are well aerated bilaterally. Focal mild
infiltrate is noted in the lateral aspect of the right lower lobe
along the diaphragm. No other focal infiltrate is seen. No sizable
parenchymal nodule is noted.

Upper Abdomen: Within normal limits.

Musculoskeletal: Within normal limits.

Review of the MIP images confirms the above findings.
IMPRESSION: Mild right lower lobe infiltrate.

No evidence of pulmonary emboli.
